# Patient Record
Sex: Female | Born: 1990 | Race: White | Hispanic: No | Marital: Single
Health system: Southern US, Community
[De-identification: ages and names within clinical notes are randomized; demographics above are authoritative.]

---
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
## 2011-03-09 NOTE — Discharge Summary (Signed)
Katrina Farrell, BICKLE             ACCOUNT NO.:  [id number]   MEDICAL RECORD NO.:  [id number]          PATIENT TYPE:  INP   LOCATION:  6119                         FACILITY:  [facility]   PHYSICIAN:  Orie Rout, M.D.DATE OF BIRTH:  12-Aug-1991   DATE OF ADMISSION:  10/31/2005  DATE OF DISCHARGE:  11/03/2005                                 DISCHARGE SUMMARY   HISTORY OF PRESENT ILLNESS:  Nilah is a 20 year old female who presented to  the emergency department with a three-day history of cough, cold, and rather  acute onset of left-sided chest pain on the evening of October 30, 2005.  Chest x-ray in the emergency department showed a left-sided pneumothorax  occupying 20-30% of the lung field.  Her pneumothorax spontaneously improved  during her hospital stay to occupy on 15% at the time of discharge.  She had  a brief trial on 100% oxygen during a four-hour period which did not change  the size of the pneumothorax.  Pigtail drainage by interventional radiology  was considered but was not required due to spontaneous improvement.  At the  time of admission, she was noted to be tall, with height at the 90th  percentile and quite thin, with weight at the 50th percentile.  Echocardiogram was performed due to concern for Marfan's-like habitus, and  this was normal.   PROCEDURES:  1.  Chest x-rays on January 10, January 11, January 12, and November 03, 2005.  2.  Echocardiogram.   DISCHARGE DIAGNOSIS:  Left-sided pneumothorax.   DISCHARGE MEDICATIONS:  None.   DISCHARGE WEIGHT:  39.4 kg.   CONDITION ON DISCHARGE:  Good.   FOLLOW UP:  The patient is to follow up at [facility] with Dr.  Oliver Pila on November 05, 2005 at 10:15 a.m.  Telephone number [phone], fax  number [phone].     ______________________________  Pediatrics Resident    ______________________________  Orie Rout, M.D.    PR/MEDQ  D:  11/03/2005  T:  11/05/2005  Job:  [id number]

## 2011-03-09 NOTE — Discharge Summary (Signed)
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  She had no oxygen  requirement and was admitted following chest x-ray confirmation of  spontaneous pneumothorax involving 20-30% of lung volume of the left lung.  She had no symptoms between her prior hospitalization and the current  admission.  She was placed on 100% nonrebreather face mask x2 nights for a  nitrogen wash-out and serial chest x-rays to follow the improvement of the  spontaneous pneumothorax.  Chest x-ray on the morning of discharge on December 30, 2005, showed 5-10% lung involvement on the left side.  The patient was  stable on room air and was symptom-free at time of discharge.  She was at  her normal baseline state of health and tolerating p.o. intake well.   OPERATIONS AND PROCEDURES:  Continuous oxygen saturation monitoring.  Chest  x-ray on December 27, 2005, and December 28, 2005, showing left spontaneous  pneumothorax.  As stated above, the chest x-ray initially showed a 20-30%  lung involvement and the follow-up chest x-ray on the date of discharge,  December 30, 2005, showed a 5-10% involvement on the left side.   DIAGNOSIS:  Spontaneous pneumothorax.   MEDICATIONS:  None.   DISCHARGE WEIGHT:  39.3 kg.   DISCHARGE CONDITION:  Improved.   DISCHARGE INSTRUCTIONS AND FOLLOW-UP:  The patient has follow-up with her  primary pediatrician, Linward Headland,  M.D., at [facility] on  Thursday, January 03, 2006, at 3 p.m., and a follow-up with Dr. Thad Ranger, pediatric surgeon at __________ on January 14, 2006, at 9 a.m.     ______________________________  Dyann Ruddle, MD    ______________________________  Dyann Ruddle, MD    LSP/MEDQ  D:  12/30/2005  T:  01/01/2006  Job:  [id number]

## 2011-04-10 IMAGING — CR DG CHEST 1V
1 series · 1 of 1 positions shown · non-contrast
Comparison: Chest x-ray of 01/31/2009

CLINICAL DATA: Follow up pneumothorax

CHEST - 1 VIEW

[w chest pa]
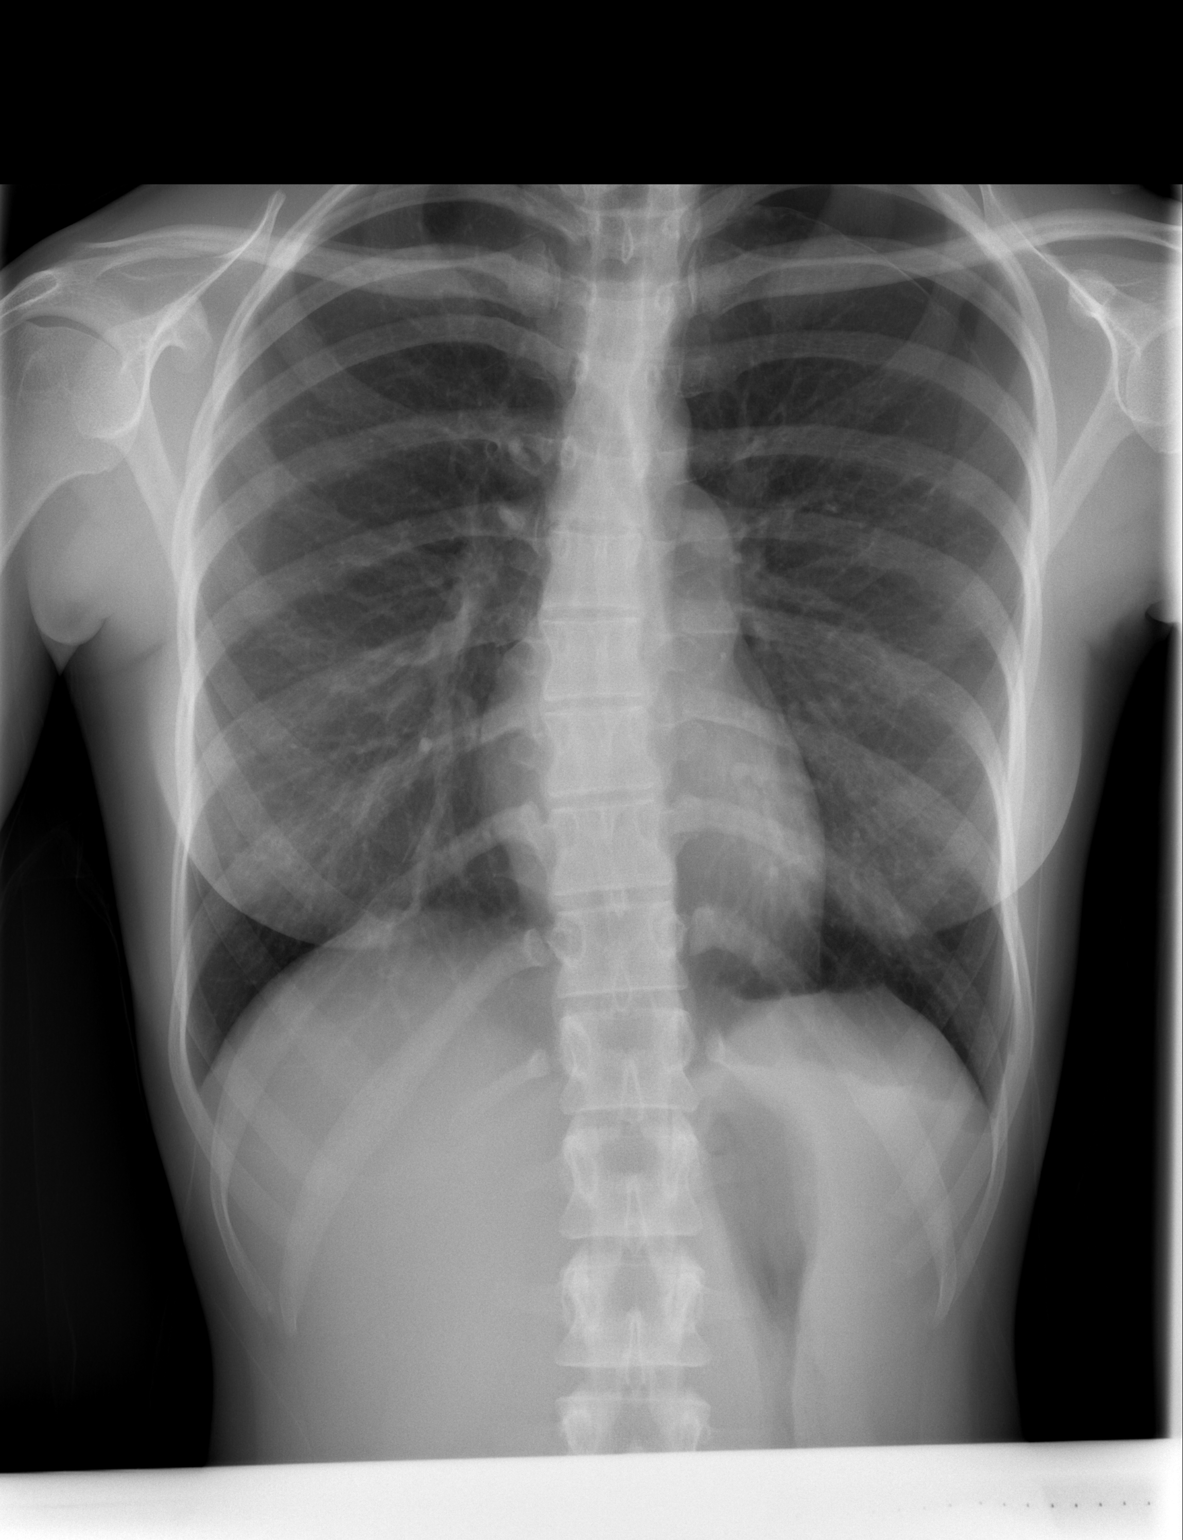

[1 of 1 positions shown; findings below may reference images not displayed]

FINDINGS: The left apical pneumothorax is stable in volume.
Otherwise the lungs are clear. The heart is within normal limits in
size. No bony abnormality is seen.
IMPRESSION: Stable left apical pneumothorax.

## 2011-07-23 LAB — URINE CULTURE

## 2011-07-23 LAB — POCT URINALYSIS DIP (DEVICE)
Bilirubin Urine: NEGATIVE
Nitrite: NEGATIVE
Protein, ur: NEGATIVE
Urobilinogen, UA: 0.2
pH: 6

## 2011-07-23 LAB — POCT PREGNANCY, URINE: Preg Test, Ur: NEGATIVE

## 2011-07-23 LAB — WET PREP, GENITAL
Clue Cells Wet Prep HPF POC: NONE SEEN
Trich, Wet Prep: NONE SEEN

## 2011-07-23 LAB — GC/CHLAMYDIA PROBE AMP, GENITAL: Chlamydia, DNA Probe: NEGATIVE

## 2011-11-05 ENCOUNTER — Telehealth: Payer: Self-pay | Admitting: Internal Medicine

## 2011-11-05 DIAGNOSIS — Z8709 Personal history of other diseases of the respiratory system: Secondary | ICD-10-CM

## 2011-11-05 NOTE — Telephone Encounter (Signed)
lmomtcb x1 

## 2011-11-06 NOTE — Telephone Encounter (Signed)
Spoke with pt. She states currently in [place], [place] on mission trip with her church. They have recommended that she be seen by Pulmonologist there is [place] since she has had past hx of pneumothorax. She states doing well right now and not having any problems. She tried to call and schedule an appt herself and each office that she calls states that she will need a referral. Dr. Sherene Sires, would you be okay with referral? Pt last seen in 2010. Please advise, thanks!

## 2011-11-06 NOTE — Telephone Encounter (Signed)
lmomtcb x1 

## 2011-11-06 NOTE — Telephone Encounter (Signed)
Yes, that's fine 

## 2011-11-06 NOTE — Telephone Encounter (Signed)
Okay, will send an order to the [facility]. Pt aware of this.

## 2014-05-04 ENCOUNTER — Telehealth: Payer: Self-pay | Admitting: *Deleted

## 2014-05-04 NOTE — Telephone Encounter (Signed)
Erroneous encounter. Opened trying to locate patient for our office.  Routing to provider for final review. Patient agreeable to disposition. Will close encounter
